# Patient Record
Sex: Female | Born: 1970
Health system: Southern US, Community
[De-identification: ages and names within clinical notes are randomized; demographics above are authoritative.]

## PROBLEM LIST (undated history)

## (undated) DIAGNOSIS — Z9851 Tubal ligation status: Secondary | ICD-10-CM

## (undated) HISTORY — DX: Tubal ligation status: Z98.51

---

## 1991-12-03 DIAGNOSIS — Z9851 Tubal ligation status: Secondary | ICD-10-CM

## 1991-12-03 HISTORY — PX: TUBAL LIGATION: SHX77

## 1991-12-03 HISTORY — DX: Tubal ligation status: Z98.51

## 2018-07-30 ENCOUNTER — Encounter: Payer: Self-pay | Admitting: Family Medicine

## 2018-08-04 ENCOUNTER — Other Ambulatory Visit (HOSPITAL_COMMUNITY)
Admission: RE | Admit: 2018-08-04 | Discharge: 2018-08-04 | Disposition: A | Discharge status: Home / Self Care | Payer: BLUE CROSS/BLUE SHIELD | Source: Ambulatory Visit | Attending: Family Medicine | Admitting: Family Medicine

## 2018-08-04 ENCOUNTER — Ambulatory Visit (INDEPENDENT_AMBULATORY_CARE_PROVIDER_SITE_OTHER): Payer: BLUE CROSS/BLUE SHIELD | Admitting: Family Medicine

## 2018-08-04 ENCOUNTER — Encounter: Payer: Self-pay | Admitting: Family Medicine

## 2018-08-04 VITALS — BP 108/66 | HR 66 | Temp 98.2°F | Ht 66.0 in | Wt 129.8 lb

## 2018-08-04 DIAGNOSIS — Z1231 Encounter for screening mammogram for malignant neoplasm of breast: Secondary | ICD-10-CM | POA: Diagnosis not present

## 2018-08-04 DIAGNOSIS — Z114 Encounter for screening for human immunodeficiency virus [HIV]: Secondary | ICD-10-CM | POA: Diagnosis not present

## 2018-08-04 DIAGNOSIS — Z0000 Encounter for general adult medical examination without abnormal findings: Secondary | ICD-10-CM

## 2018-08-04 DIAGNOSIS — Z01419 Encounter for gynecological examination (general) (routine) without abnormal findings: Secondary | ICD-10-CM

## 2018-08-04 DIAGNOSIS — Z124 Encounter for screening for malignant neoplasm of cervix: Secondary | ICD-10-CM

## 2018-08-04 DIAGNOSIS — Z23 Encounter for immunization: Secondary | ICD-10-CM | POA: Diagnosis not present

## 2018-08-04 DIAGNOSIS — Z1239 Encounter for other screening for malignant neoplasm of breast: Secondary | ICD-10-CM

## 2018-08-04 LAB — COMPREHENSIVE METABOLIC PANEL
ALBUMIN: 4.2 g/dL (ref 3.5–5.2)
ALT: 10 U/L (ref 0–35)
AST: 14 U/L (ref 0–37)
Alkaline Phosphatase: 40 U/L (ref 39–117)
BILIRUBIN TOTAL: 1 mg/dL (ref 0.2–1.2)
BUN: 19 mg/dL (ref 6–23)
CALCIUM: 9 mg/dL (ref 8.4–10.5)
CHLORIDE: 102 meq/L (ref 96–112)
CO2: 29 meq/L (ref 19–32)
CREATININE: 0.78 mg/dL (ref 0.40–1.20)
GFR: 83.62 mL/min (ref >60.00)
Glucose, Bld: 108 mg/dL — ABNORMAL HIGH (ref 70–99)
Potassium: 3.5 mEq/L (ref 3.5–5.1)
SODIUM: 139 meq/L (ref 135–145)
Total Protein: 6.6 g/dL (ref 6.0–8.3)

## 2018-08-04 LAB — CBC
HCT: 35.1 % — ABNORMAL LOW (ref 36.0–46.0)
Hemoglobin: 11.7 g/dL — ABNORMAL LOW (ref 12.0–15.0)
MCHC: 33.3 g/dL (ref 30.0–36.0)
MCV: 84.3 fl (ref 78.0–100.0)
PLATELETS: 265 10*3/uL (ref 150.0–400.0)
RBC: 4.17 Mil/uL (ref 3.87–5.11)
RDW: 17.2 % — ABNORMAL HIGH (ref 11.5–15.5)
WBC: 7 10*3/uL (ref 4.0–10.5)

## 2018-08-04 NOTE — Patient Instructions (Signed)
Great to meet you! 

## 2018-08-04 NOTE — Progress Notes (Signed)
Subjective:    Patient ID: Joanna Becker, female    DOB: 03/11/1971, 47 y.o.   MRN: [id number]  HPI  Patient presents to clinic as a new patient for physical exam.  Currently takes no medications and reports no chronic medical issues.  She is a non-smoker.  Had a tubal ligation in 1993 but otherwise reports no surgeries.  She is unsure of her last Pap smear, we will get this done today.  Last mammogram was May be 2 or 3 years ago.  Also unsure of last tetanus injection.  Declines flu vaccine today.  Reports no history of any cancers in her family, so she will not need colonoscopy screening until age 47. Also reports no other significant family history at this time.  Social History   Tobacco Use  . Smoking status: Never Smoker  . Smokeless tobacco: Never Used  Substance Use Topics  . Alcohol use: Yes    Past Surgical History:  Procedure Laterality Date  . TUBAL LIGATION  1993   Review of Systems  Constitutional: Negative for chills, fatigue and fever.  HENT: Negative for congestion, ear pain, sinus pain and sore throat.   Eyes: Negative.   Respiratory: Negative for cough, shortness of breath and wheezing.   Cardiovascular: Negative for chest pain, palpitations and leg swelling.  Gastrointestinal: Negative for abdominal pain, diarrhea, nausea and vomiting.  Genitourinary: Negative for dysuria, frequency and urgency.  Musculoskeletal: Negative for arthralgias and myalgias.  Skin: Negative for color change, pallor and rash.  Neurological: Negative for syncope, light-headedness and headaches.  Psychiatric/Behavioral: The patient is not nervous/anxious.       Objective:   Physical Exam  Constitutional: She is oriented to person, place, and time. She appears well-developed and well-nourished. No distress.  HENT:  Head: Normocephalic and atraumatic.  Right Ear: External ear normal.  Left Ear: External ear normal.  Mouth/Throat: Oropharynx is clear and moist. No  oropharyngeal exudate.  Eyes: Pupils are equal, round, and reactive to light. Conjunctivae and EOM are normal. No scleral icterus.  Neck: Normal range of motion. Neck supple. No JVD present. No tracheal deviation present. No thyromegaly present.  Cardiovascular: Normal rate and regular rhythm.  No murmur heard. Pulmonary/Chest: Effort normal and breath sounds normal. No respiratory distress. Right breast exhibits no inverted nipple, no mass, no nipple discharge, no skin change and no tenderness. Left breast exhibits no inverted nipple, no mass, no nipple discharge, no skin change and no tenderness.  No palpable lymphadenopathy in bilateral axilla.  Genitourinary: Rectum normal, vagina normal and uterus normal. Cervix exhibits no motion tenderness and no discharge. Right adnexum displays no tenderness. Left adnexum displays no tenderness. No erythema, tenderness or bleeding in the vagina. No foreign body in the vagina. No signs of injury around the vagina. No vaginal discharge found.  Genitourinary Comments: Pap smear collected and sent to lab  Musculoskeletal: Normal range of motion. She exhibits no edema.  Neurological: She is alert and oriented to person, place, and time. No cranial nerve deficit. Coordination normal.  Skin: Skin is warm and dry. Capillary refill takes less than 2 seconds. No pallor.  Psychiatric: She has a normal mood and affect. Her behavior is normal. Thought content normal.  Nursing note and vitals reviewed.  Vitals:   08/04/18 1404  BP: 108/66  Pulse: 66  Temp: 98.2 F (36.8 C)  SpO2: 98%      Assessment & Plan:   Adult wellness -patient will get a CBC, CMP and  HIV screening as part wellness exam lab work.  We will hold off on lipid panel due to patient not be fasting at this time.  Cervical cancer screening performed via Pap smear.  Mammogram ordered for breast cancer screening.  Patient given tetanus vaccine today so she is up to date with her current health  maintenance requirements for her age.  Good diet and regular exercise recommended.   Patient is a healthy 47 year old female.  Follow-up in 1 year for physical exam, return to clinic sooner if issues arise.

## 2018-08-05 ENCOUNTER — Encounter: Payer: Self-pay | Admitting: Family Medicine

## 2018-08-05 LAB — HIV ANTIBODY (ROUTINE TESTING W REFLEX): HIV 1&2 Ab, 4th Generation: NONREACTIVE

## 2018-08-10 LAB — CYTOLOGY - PAP
DIAGNOSIS: NEGATIVE
HPV (WINDOPATH): NOT DETECTED

## 2018-08-13 ENCOUNTER — Telehealth: Payer: Self-pay | Admitting: Family Medicine

## 2018-08-13 NOTE — Telephone Encounter (Signed)
Pt dropped off cpe form to be signed. placed in G[facility] color folder upfront. Please call [phone] when completed

## 2018-08-20 NOTE — Telephone Encounter (Signed)
Patient called back and ask could she Email form advised patient this was fine, email came through with patient on phone advised form completed and ready for Pick up. Placed form at front desk for pickup.

## 2018-08-24 ENCOUNTER — Encounter: Payer: Self-pay | Admitting: Family Medicine

## 2018-08-24 ENCOUNTER — Ambulatory Visit (INDEPENDENT_AMBULATORY_CARE_PROVIDER_SITE_OTHER): Payer: BLUE CROSS/BLUE SHIELD | Admitting: Family Medicine

## 2018-08-24 VITALS — BP 102/68 | HR 57 | Temp 98.3°F | Ht 66.0 in | Wt 128.6 lb

## 2018-08-24 DIAGNOSIS — N924 Excessive bleeding in the premenopausal period: Secondary | ICD-10-CM | POA: Diagnosis not present

## 2018-08-24 DIAGNOSIS — N939 Abnormal uterine and vaginal bleeding, unspecified: Secondary | ICD-10-CM | POA: Diagnosis not present

## 2018-08-24 LAB — CBC WITH DIFFERENTIAL/PLATELET
Basophils Absolute: 0 10*3/uL (ref 0.0–0.1)
Basophils Relative: 0.8 % (ref 0.0–3.0)
EOS PCT: 3.9 % (ref 0.0–5.0)
Eosinophils Absolute: 0.2 10*3/uL (ref 0.0–0.7)
HCT: 30.3 % — ABNORMAL LOW (ref 36.0–46.0)
Hemoglobin: 10.2 g/dL — ABNORMAL LOW (ref 12.0–15.0)
LYMPHS ABS: 1.2 10*3/uL (ref 0.7–4.0)
Lymphocytes Relative: 24.5 % (ref 12.0–46.0)
MCHC: 33.6 g/dL (ref 30.0–36.0)
MCV: 84.4 fl (ref 78.0–100.0)
MONO ABS: 0.4 10*3/uL (ref 0.1–1.0)
Monocytes Relative: 8.2 % (ref 3.0–12.0)
NEUTROS ABS: 3.1 10*3/uL (ref 1.4–7.7)
Neutrophils Relative %: 62.6 % (ref 43.0–77.0)
Platelets: 287 10*3/uL (ref 150.0–400.0)
RBC: 3.58 Mil/uL — ABNORMAL LOW (ref 3.87–5.11)
RDW: 16.9 % — AB (ref 11.5–15.5)
WBC: 4.9 10*3/uL (ref 4.0–10.5)

## 2018-08-24 LAB — FOLLICLE STIMULATING HORMONE: FSH: 15 m[IU]/mL

## 2018-08-24 LAB — LUTEINIZING HORMONE: LH: 2.08 m[IU]/mL

## 2018-08-24 NOTE — Progress Notes (Signed)
   Subjective:    Patient ID: Joanna Becker, female    DOB: 29-Oct-1971, 47 y.o.   MRN: [id number]  HPI  Patient presents to clinic due to heavy menses for 2 weeks.  States her periods have become more irregular over the past year or so.  States she has been going through a large pad every 2-3 hours, describes bleeding as bright [place] with thick clots.  Pap smear was done on August 04, 2018 negative for abnormality and pelvic exam was normal at that time.  States she spoke to her mother and mom states she went through menopause in her late 6530s, also states her mom told her her menopausal symptoms began with very heavy periods.   Social History   Tobacco Use  . Smoking status: Never Smoker  . Smokeless tobacco: Never Used  Substance Use Topics  . Alcohol use: Yes   Review of Systems  Constitutional: Negative for chills, fatigue and fever.  HENT: Negative for congestion, ear pain, sinus pain and sore throat.   Eyes: Negative.   Respiratory: Negative for cough, shortness of breath and wheezing.   Cardiovascular: Negative for chest pain, palpitations and leg swelling.  Gastrointestinal: Negative for abdominal pain, diarrhea, nausea and vomiting.  Genitourinary: Negative for dysuria, frequency and urgency.  Musculoskeletal: Negative for arthralgias and myalgias.  Skin: Negative for color change, pallor and rash.  Neurological: Negative for syncope, light-headedness and headaches.  Psychiatric/Behavioral: The patient is not nervous/anxious.       Objective:   Physical Exam  Constitutional: She is oriented to person, place, and time. She appears well-developed and well-nourished. No distress.  HENT:  Head: Normocephalic and atraumatic.  Eyes: No scleral icterus.  Neck: Neck supple. No tracheal deviation present.  Cardiovascular: Normal rate and regular rhythm.  Pulmonary/Chest: Effort normal and breath sounds normal.  Abdominal: Soft. Bowel sounds are normal. She exhibits no  distension. There is no tenderness.  Musculoskeletal: She exhibits no edema.  Neurological: She is alert and oriented to person, place, and time.  Skin: Skin is warm and dry. Capillary refill takes less than 2 seconds. No rash noted. No pallor.  Psychiatric: She has a normal mood and affect. Her behavior is normal.  Nursing note and vitals reviewed.   Vitals:   08/24/18 0907  BP: 102/68  Pulse: (!) 57  Temp: 98.3 F (36.8 C)  SpO2: 99%      Assessment & Plan:    Abnormal vaginal bleeding - we will get lab work to check CBC, iron levels and also hormone levels to further assess if patient is in menopause.  I suspect she could be perimenopausal due to her mother's younger age of going through menopause, and the symptoms patient currently having a very similar to symptoms her mother experience at the beginning of her menopause.  We will also get pelvic ultrasound for imaging of the uterus and ovaries to ensure she does not have fibroids or cysts that could be contributing to this heavy bleeding.    We will base next step in plan of care off the results of the lab work and also pelvic ultrasound.

## 2018-08-28 NOTE — Addendum Note (Signed)
Addended by: Leanora Cover on: 08/28/2018 12:45 PM   Modules accepted: Orders

## 2018-08-29 LAB — IRON,TIBC AND FERRITIN PANEL
%SAT: 11 % (calc) — ABNORMAL LOW (ref 16–45)
FERRITIN: 10 ng/mL — AB (ref 16–232)
Iron: 36 ug/dL — ABNORMAL LOW (ref 40–190)
TIBC: 338 mcg/dL (calc) (ref 250–450)

## 2018-08-29 LAB — PROGESTERONE

## 2018-08-29 LAB — ESTROGENS, TOTAL: ESTROGEN: 298.6 pg/mL

## 2018-08-31 NOTE — Addendum Note (Signed)
Addended by: Leanora Cover on: 08/31/2018 08:17 AM   Modules accepted: Orders

## 2018-09-01 ENCOUNTER — Ambulatory Visit
Admission: RE | Admit: 2018-09-01 | Discharge: 2018-09-01 | Disposition: A | Discharge status: Home / Self Care | Payer: BLUE CROSS/BLUE SHIELD | Source: Ambulatory Visit | Attending: Family Medicine | Admitting: Family Medicine

## 2018-09-01 DIAGNOSIS — N83201 Unspecified ovarian cyst, right side: Secondary | ICD-10-CM | POA: Diagnosis not present

## 2018-09-01 DIAGNOSIS — N939 Abnormal uterine and vaginal bleeding, unspecified: Secondary | ICD-10-CM | POA: Insufficient documentation

## 2018-09-02 ENCOUNTER — Encounter: Payer: Self-pay | Admitting: Lab

## 2018-09-10 ENCOUNTER — Telehealth: Payer: Self-pay | Admitting: Family Medicine

## 2018-09-10 ENCOUNTER — Encounter: Payer: BLUE CROSS/BLUE SHIELD | Admitting: Obstetrics and Gynecology

## 2018-09-10 DIAGNOSIS — N924 Excessive bleeding in the premenopausal period: Secondary | ICD-10-CM

## 2018-09-10 DIAGNOSIS — E611 Iron deficiency: Secondary | ICD-10-CM

## 2018-09-10 MED ORDER — FERROUS SULFATE 325 (65 FE) MG PO TABS: 325.0000 mg | ORAL_TABLET | Freq: Every day | ORAL | 1 refills | Status: AC

## 2018-09-10 NOTE — Telephone Encounter (Signed)
Patient returning call.

## 2018-09-10 NOTE — Telephone Encounter (Signed)
Copied from CRM [id number]. Topic: Quick Communication - See Telephone Encounter >> Sep 10, 2018 11:57 AM Windy Kalata, NT wrote: CRM for notification. See Telephone encounter for: 09/10/18.  Patient is calling and states she missed a call from Leanora Cover needing the pharmacy to send her iron supplement too.  [facility] [address] ([address]), [place] - [address] [place] ([place]) [place] [address] Phone: [phone] Fax: [phone]

## 2018-09-10 NOTE — Telephone Encounter (Signed)
Iron sent in  Has her bleeding stopped? Did she see GYN yet?

## 2018-09-10 NOTE — Telephone Encounter (Signed)
Called Pt to tell her of Iron Rx at the pharmacy. Pt also said that her bleeding stopped on 9/30, Her Gyn appt is Oct 21st and she did have a [place] app on Oct 2nd( and she went to that appt)

## 2018-09-10 NOTE — Telephone Encounter (Signed)
Patient  called and stated that she missed a call from Lauren Guse needing the pharmacy to send her iron supplement too.  [facility] [address] ([address]), [place] - [address] [place] ([place]) [place] [address] Phone: [phone] Fax: [phone]

## 2018-09-10 NOTE — Telephone Encounter (Signed)
OK thank you 

## 2018-09-21 ENCOUNTER — Encounter: Payer: BLUE CROSS/BLUE SHIELD | Admitting: Obstetrics & Gynecology

## 2018-10-06 ENCOUNTER — Encounter: Payer: Self-pay | Admitting: Obstetrics & Gynecology

## 2018-10-06 ENCOUNTER — Encounter: Payer: Self-pay | Admitting: Family Medicine

## 2018-10-06 ENCOUNTER — Ambulatory Visit (INDEPENDENT_AMBULATORY_CARE_PROVIDER_SITE_OTHER): Payer: BLUE CROSS/BLUE SHIELD | Admitting: Obstetrics & Gynecology

## 2018-10-06 VITALS — BP 102/63 | HR 64 | Ht 66.0 in | Wt 128.0 lb

## 2018-10-06 DIAGNOSIS — N924 Excessive bleeding in the premenopausal period: Secondary | ICD-10-CM

## 2018-10-06 NOTE — Progress Notes (Signed)
GYNECOLOGY OFFICE VISIT NOTE  History:  47 y.o. G2P0 here today for discussion about abnormal bleeding attributed to perimenopause.  Had irregular bleeding last month and was evaluated by her PCP. Normal labs, normal pelvic ultrasound, benign pap. She was told to follow up here for further management. No bleeding currently. She denies any current abnormal vaginal discharge, pelvic pain or other concerns.   Past Medical History:  Diagnosis Date  . Tubal ligation status 1993    Past Surgical History:  Procedure Laterality Date  . TUBAL LIGATION  1993    The following portions of the patient's history were reviewed and updated as appropriate: allergies, current medications, past family history, past medical history, past social history, past surgical history and problem list.   Health Maintenance:  Normal pap and negative HRHPV on 08/04/2018.  Normal mammogram last year, next one ordered.  Review of Systems:  Pertinent items noted in HPI and remainder of comprehensive ROS otherwise negative.  Objective:  Physical Exam BP 102/63   Pulse 64   Ht 5\' 6"  (1.676 m)   Wt 128 lb (58.1 kg)   LMP 09/19/2018 (Exact Date)   BMI 20.66 kg/m  CONSTITUTIONAL: Well-developed, well-nourished female in no acute distress.  HEENT:  Normocephalic, atraumatic. External right and left ear normal. No scleral icterus.  NECK: Normal range of motion, supple, no masses noted on observation SKIN: Skin is warm and dry. No rash noted. Not diaphoretic. No erythema. No pallor. MUSCULOSKELETAL: Normal range of motion. No edema noted. NEUROLOGIC: Alert and oriented to person, place, and time. Normal muscle tone coordination. No cranial nerve deficit noted. PSYCHIATRIC: Normal mood and affect. Normal behavior. Normal judgment and thought content. CARDIOVASCULAR: Normal heart rate noted RESPIRATORY: Effort and breath sounds normal, no problems with respiration noted ABDOMEN: Soft, no distention noted.   PELVIC:  Deferred  Labs and Imaging CBC Latest Ref Rng & Units 08/24/2018 08/04/2018  WBC 4.0 - 10.5 K/uL 4.9 7.0  Hemoglobin 12.0 - 15.0 g/dL 10.2(L) 11.7(L)  Hematocrit 36.0 - 46.0 % 30.3(L) 35.1(L)  Platelets 150.0 - 400.0 K/uL 287.0 265.0   US Pelvic Complete With Transvaginal  Result Date: 09/01/2018 CLINICAL DATA:  Heavy vaginal bleeding for 2 weeks EXAM: TRANSABDOMINAL AND TRANSVAGINAL ULTRASOUND OF PELVIS TECHNIQUE: Both transabdominal and transvaginal ultrasound examinations of the pelvis were performed. Transabdominal technique was performed for global imaging of the pelvis including uterus, ovaries, adnexal regions, and pelvic cul-de-sac. It was necessary to proceed with endovaginal exam following the transabdominal exam to visualize the uterus endometrium and ovaries. COMPARISON:  None FINDINGS: Uterus Measurements: 8.1 x 4.2 x 5.5 cm. No fibroids or other mass visualized. Endometrium Thickness: 10.5 mm.  No focal abnormality visualized. Right ovary Measurements: 4.3 x 3.2 x 3.7 cm. Prominent cyst in the right ovary measuring 3.7 x 2.9 x 3.5 cm. Adjacent smaller cysts in the right ovary. Left ovary Not seen Other findings No abnormal free fluid. IMPRESSION: 1. Endometrial thickness of 10.5 mm. If bleeding remains unresponsive to hormonal or medical therapy, sonohysterogram should be considered for focal lesion work-up. (Ref: Radiological Reasoning: Algorithmic Workup of Abnormal Vaginal Bleeding with Endovaginal Sonography and Sonohysterography. AJR 2008; 469:G29-52) 2. 3.7 cm right ovarian cyst. This is almost certainly benign, and no specific imaging follow up is recommended according to the Society of Radiologists in Ultrasound2010 Consensus Conference Statement (D Lenis Noon et al. Management of Asymptomatic Ovarian and Other Adnexal Cysts Imaged at [place]: Society of Radiologists in Ultrasound Consensus Conference Statement 2010. Radiology 256 (Sept  2010): 943-954.). Electronically Signed   By: Jasmine Pang  M.D.   On: 09/01/2018 16:57   Assessment & Plan:  Abnormal perimenopausal bleeding Labs and imaging results reviewed. No current bleeding. Discussed perimenopausal bleeding which could be irregular and heavy at time. Intervention is needed for prolonged bleeding or symptomatic anemia due to the bleeding.  Also, endometrial sampling or other surgical intervention could be needed.  If bleeding recurs and gets concerning, we could consider oral progesterone, Depo Provera, Levonogestrel IUD, endometrial ablation or hysterectomy as definitive surgical management. Printed patient education handouts were given to the patient to review at home.  Bleeding precautions reviewed, she was told to contact us for any concerns.    Please refer to After Visit Summary for other counseling recommendations.   Return if symptoms worsen or fail to improve.   Total face-to-face time with patient: 20 minutes.  Over 50% of encounter was spent on counseling and coordination of care.   Jaynie Collins, MD, FACOG Obstetrician & Gynecologist, [facility] for [employer], [facility]

## 2018-10-06 NOTE — Patient Instructions (Signed)
Perimenopause Perimenopause is the time when your body begins to move into the menopause (no menstrual period for 12 straight months). It is a natural process. Perimenopause can begin 2-8 years before the menopause and usually lasts for 1 year after the menopause. During this time, your ovaries may or may not produce an egg. The ovaries vary in their production of estrogen and progesterone hormones each month. This can cause irregular menstrual periods, difficulty getting pregnant, vaginal bleeding between periods, and uncomfortable symptoms. What are the causes?  Irregular production of the ovarian hormones, estrogen and progesterone, and not ovulating every month. Other causes include:  Tumor of the pituitary gland in the brain.  Medical disease that affects the ovaries.  Radiation treatment.  Chemotherapy.  Unknown causes.  Heavy smoking and excessive alcohol intake can bring on perimenopause sooner.  What are the signs or symptoms?  Hot flashes.  Night sweats.  Irregular menstrual periods.  Decreased sex drive.  Vaginal dryness.  Headaches.  Mood swings.  Depression.  Memory problems.  Irritability.  Tiredness.  Weight gain.  Trouble getting pregnant.  The beginning of losing bone cells (osteoporosis).  The beginning of hardening of the arteries (atherosclerosis). How is this diagnosed? Your health care provider will make a diagnosis by analyzing your age, menstrual history, and symptoms. He or she will do a physical exam and note any changes in your body, especially your female organs. Female hormone tests may or may not be helpful depending on the amount of female hormones you produce and when you produce them. However, other hormone tests may be helpful to rule out other problems. How is this treated? In some cases, no treatment is needed. The decision on whether treatment is necessary during the perimenopause should be made by you and your health care  provider based on how the symptoms are affecting you and your lifestyle. Various treatments are available, such as:  Treating individual symptoms with a specific medicine for that symptom.  Herbal medicines that can help specific symptoms.  Counseling.  Group therapy.  Follow these instructions at home:  Keep track of your menstrual periods (when they occur, how heavy they are, how long between periods, and how long they last) as well as your symptoms and when they started.  Only take over-the-counter or prescription medicines as directed by your health care provider.  Sleep and rest.  Exercise.  Eat a diet that contains calcium (good for your bones) and soy (acts like the estrogen hormone).  Do not smoke.  Avoid alcoholic beverages.  Take vitamin supplements as recommended by your health care provider. Taking vitamin E may help in certain cases.  Take calcium and vitamin D supplements to help prevent bone loss.  Group therapy is sometimes helpful.  Acupuncture may help in some cases. Contact a health care provider if:  You have questions about any symptoms you are having.  You need a referral to a specialist (gynecologist, psychiatrist, or psychologist). Get help right away if:  You have vaginal bleeding.  Your period lasts longer than 8 days.  Your periods are recurring sooner than 21 days.  You have bleeding after intercourse.  You have severe depression.  You have pain when you urinate.  You have severe headaches.  You have vision problems. This information is not intended to replace advice given to you by your health care provider. Make sure you discuss any questions you have with your health care provider. Document Released: 12/26/2004 Document Revised: 04/25/2016 Document Reviewed: 06/17/2013  Elsevier Interactive Patient Education  2017 [employer]     Endometrial Ablation Endometrial ablation is a procedure that destroys the thin inner layer  of the lining of the uterus (endometrium). This procedure may be done:  To stop heavy periods.  To stop bleeding that is causing anemia.  To control irregular bleeding.  To treat bleeding caused by small tumors (fibroids) in the endometrium.  This procedure is often an alternative to major surgery, such as removal of the uterus and cervix (hysterectomy). As a result of this procedure:  You may not be able to have children. However, if you are premenopausal (you have not gone through menopause): ? You may still have a small chance of getting pregnant. ? You will need to use a reliable method of birth control after the procedure to prevent pregnancy.  You may stop having a menstrual period, or you may have only a small amount of bleeding during your period. Menstruation may return several years after the procedure.  Tell a health care provider about:  Any allergies you have.  All medicines you are taking, including vitamins, herbs, eye drops, creams, and over-the-counter medicines.  Any problems you or family members have had with the use of anesthetic medicines.  Any blood disorders you have.  Any surgeries you have had.  Any medical conditions you have. What are the risks? Generally, this is a safe procedure. However, problems may occur, including:  A hole (perforation) in the uterus or bowel.  Infection of the uterus, bladder, or vagina.  Bleeding.  Damage to other structures or organs.  An air bubble in the lung (air embolus).  Problems with pregnancy after the procedure.  Failure of the procedure.  Decreased ability to diagnose cancer in the endometrium.  What happens before the procedure?  You will have tests of your endometrium to make sure there are no pre-cancerous cells or cancer cells present.  You may have an ultrasound of the uterus.  You may be given medicines to thin the endometrium.  Ask your health care provider about: ? Changing or stopping  your regular medicines. This is especially important if you take diabetes medicines or blood thinners. ? Taking medicines such as aspirin and ibuprofen. These medicines can thin your blood. Do not take these medicines before your procedure if your doctor tells you not to.  Plan to have someone take you home from the hospital or clinic. What happens during the procedure?  You will lie on an exam table with your feet and legs supported as in a pelvic exam.  To lower your risk of infection: ? Your health care team will wash or sanitize their hands and put on germ-free (sterile) gloves. ? Your genital area will be washed with soap.  An IV tube will be inserted into one of your veins.  You will be given a medicine to help you relax (sedative).  A surgical instrument with a light and camera (resectoscope) will be inserted into your vagina and moved into your uterus. This allows your surgeon to see inside your uterus.  Endometrial tissue will be removed using one of the following methods: ? Radiofrequency. This method uses a radiofrequency-alternating electric current to remove the endometrium. ? Cryotherapy. This method uses extreme cold to freeze the endometrium. ? Heated-free liquid. This method uses a heated saltwater (saline) solution to remove the endometrium. ? Microwave. This method uses high-energy microwaves to heat up the endometrium and remove it. ? Thermal balloon. This method involves inserting  a catheter with a balloon tip into the uterus. The balloon tip is filled with heated fluid to remove the endometrium. The procedure may vary among health care providers and hospitals. What happens after the procedure?  Your blood pressure, heart rate, breathing rate, and blood oxygen level will be monitored until the medicines you were given have worn off.  As tissue healing occurs, you may notice vaginal bleeding for 4-6 weeks after the procedure. You may also  experience: ? Cramps. ? Thin, watery vaginal discharge that is light pink or brown in color. ? A need to urinate more frequently than usual. ? Nausea.  Do not drive for 24 hours if you were given a sedative.  Do not have sex or insert anything into your vagina until your health care provider approves. Summary  Endometrial ablation is done to treat the many causes of heavy menstrual bleeding.  The procedure may be done only after medications have been tried to control the bleeding.  Plan to have someone take you home from the hospital or clinic. This information is not intended to replace advice given to you by your health care provider. Make sure you discuss any questions you have with your health care provider. Document Released: 09/27/2004 Document Revised: 12/05/2016 Document Reviewed: 12/05/2016 Elsevier Interactive Patient Education  2017 [employer]    Hysterectomy Information A hysterectomy is a surgery to remove your uterus. After surgery, you will no longer have periods. Also, you will not be able to get pregnant. Reasons for this surgery  You have bleeding that is not normal and keeps coming back.  You have lasting (chronic) lower belly (pelvic) pain.  You have a lasting infection.  The lining of your uterus grows outside your uterus.  The lining of your uterus grows in the muscle of your uterus.  Your uterus falls down into your vagina.  You have a growth in your uterus that causes problems.  You have cells that could turn into cancer (precancerous cells).  You have cancer of the uterus or cervix. Types There are 3 types of hysterectomies. Depending on the type, the surgery will:  Remove the top part of the uterus only.  Remove the uterus and the cervix.  Remove the uterus, cervix, and tissue that holds the uterus in place in the lower belly.  Ways a hysterectomy can be performed There are 5 ways this surgery can be performed.  A cut (incision) is  made in the belly (abdomen). The uterus is taken out through the cut.  A cut is made in the vagina. The uterus is taken out through the cut.  Three or four cuts are made in the belly. A surgical device with a camera is put through one of the cuts. The uterus is cut into small pieces. The uterus is taken out through the cuts or the vagina.  Three or four cuts are made in the belly. A surgical device with a camera is put through one of the cuts. The uterus is taken out through the vagina.  Three or four cuts are made in the belly. A surgical device that is controlled by a computer makes a visual image. The device helps the surgeon control the surgical tools. The uterus is cut into small pieces. The pieces are taken out through the cuts or through the vagina.  What can I expect after the surgery?  You will be given pain medicine.  You will need help at home for 3-5 days after surgery.  You will need to see your doctor in 2-4 weeks after surgery.  You may get hot flashes, have night sweats, and have trouble sleeping.  You may need to have Pap tests in the future if your surgery was related to cancer. Talk to your doctor. It is still good to have regular exams. This information is not intended to replace advice given to you by your health care provider. Make sure you discuss any questions you have with your health care provider. Document Released: 02/10/2012 Document Revised: 04/25/2016 Document Reviewed: 07/26/2013 Elsevier Interactive Patient Education  [employer].

## 2018-10-14 ENCOUNTER — Other Ambulatory Visit: Payer: Self-pay | Admitting: Family Medicine

## 2018-10-14 DIAGNOSIS — Z1239 Encounter for other screening for malignant neoplasm of breast: Secondary | ICD-10-CM

## 2018-12-04 ENCOUNTER — Ambulatory Visit
Admission: RE | Admit: 2018-12-04 | Discharge: 2018-12-04 | Disposition: A | Discharge status: Home / Self Care | Payer: Commercial Managed Care - PPO | Source: Ambulatory Visit | Attending: Family Medicine | Admitting: Family Medicine

## 2018-12-04 DIAGNOSIS — Z1239 Encounter for other screening for malignant neoplasm of breast: Secondary | ICD-10-CM | POA: Diagnosis not present

## 2018-12-16 ENCOUNTER — Encounter: Payer: Self-pay | Admitting: Lab

## 2019-01-23 IMAGING — MG DIGITAL SCREENING BILATERAL MAMMOGRAM WITH TOMO AND CAD
8 series · 9 of 24 positions shown · non-contrast
Comparison: Previous exam(s).

CLINICAL DATA: Screening.

EXAM:
DIGITAL SCREENING BILATERAL MAMMOGRAM WITH TOMO AND CAD

[L MLO synth-2D]
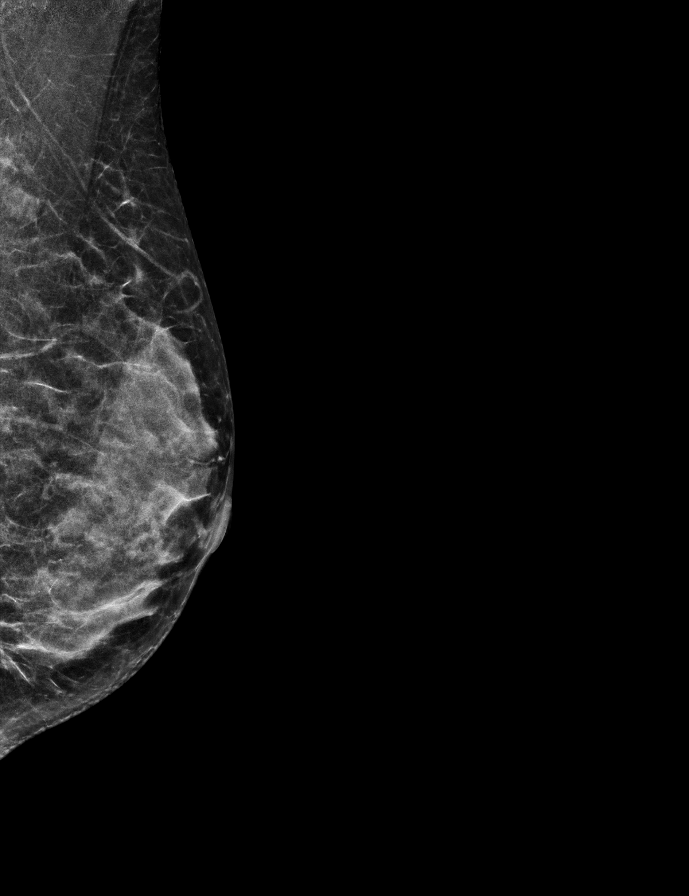

[R CC synth-2D]
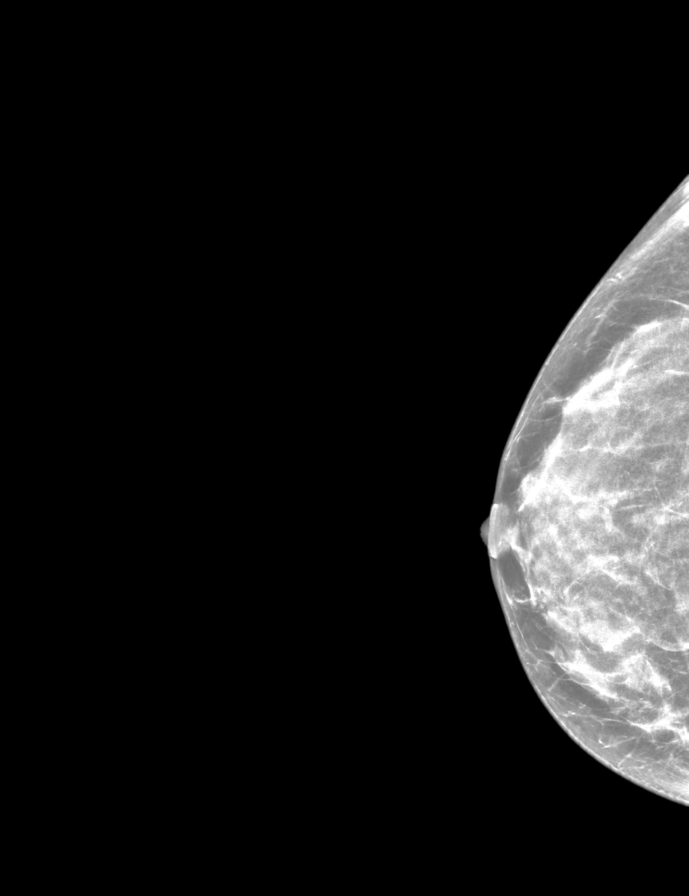

[R MLO synth-2D]
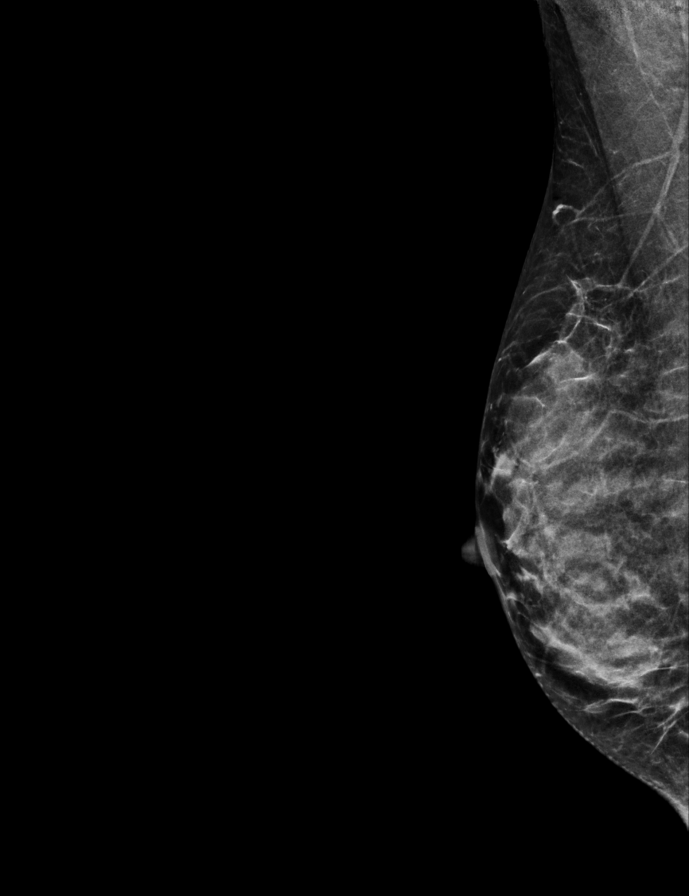

[L CC synth-2D]
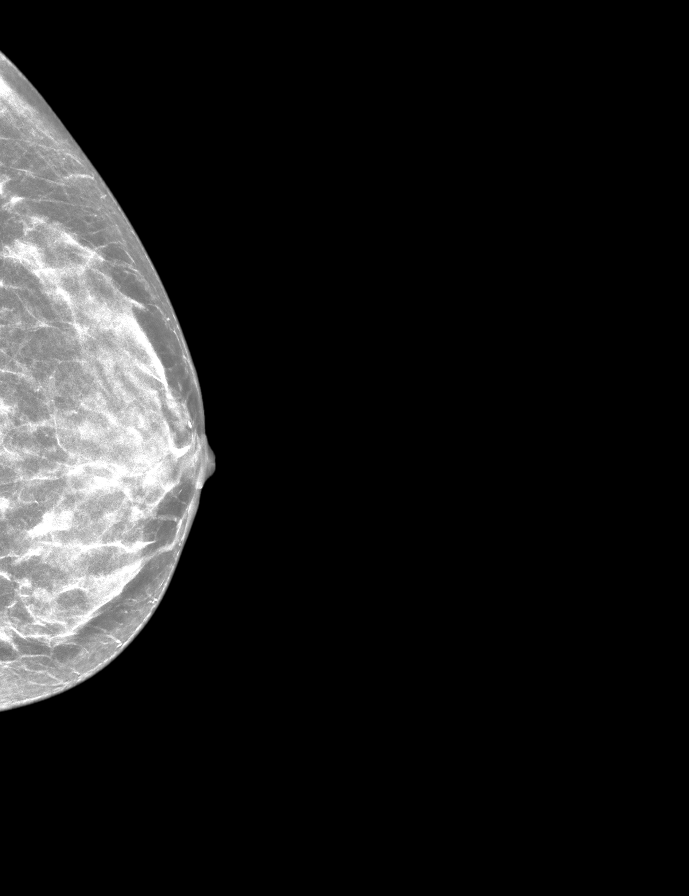

[L CC tomo · 2 of 40 frames shown]
[frame 13/40]
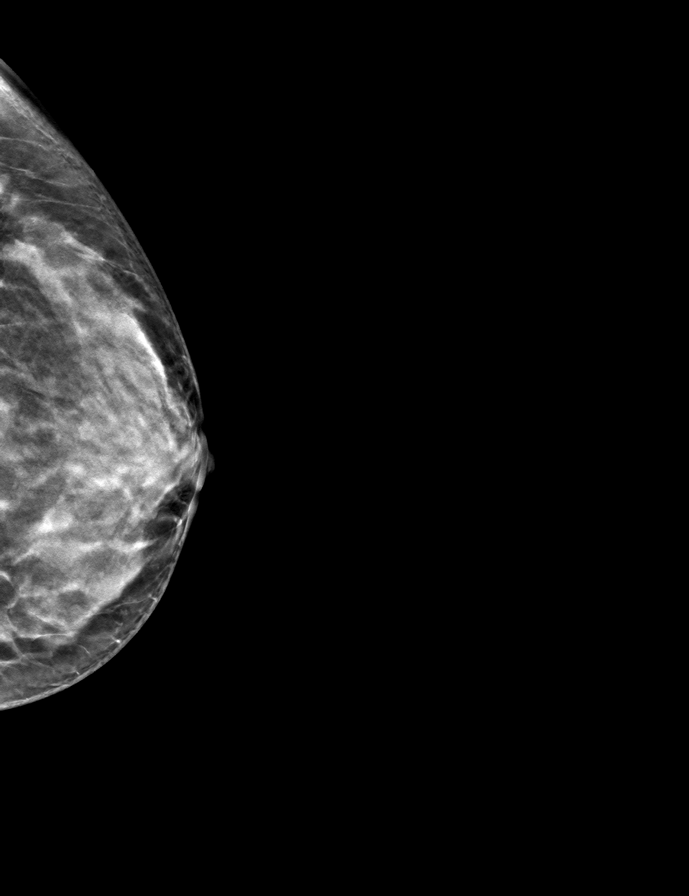
[frame 21/40]
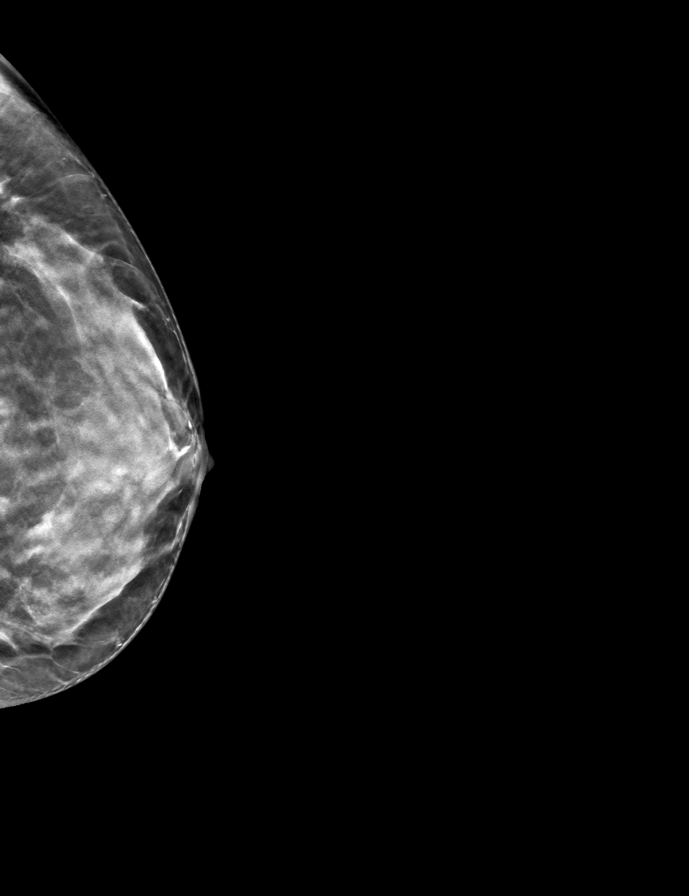

[R MLO tomo · tomo slice 21/41.0]
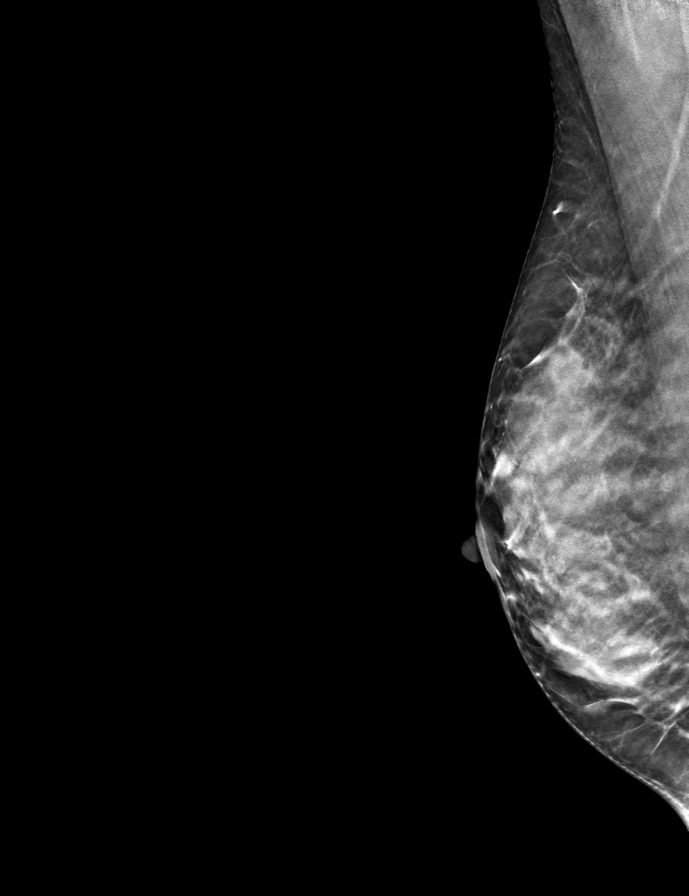

[L MLO tomo · tomo slice 23/44.0]
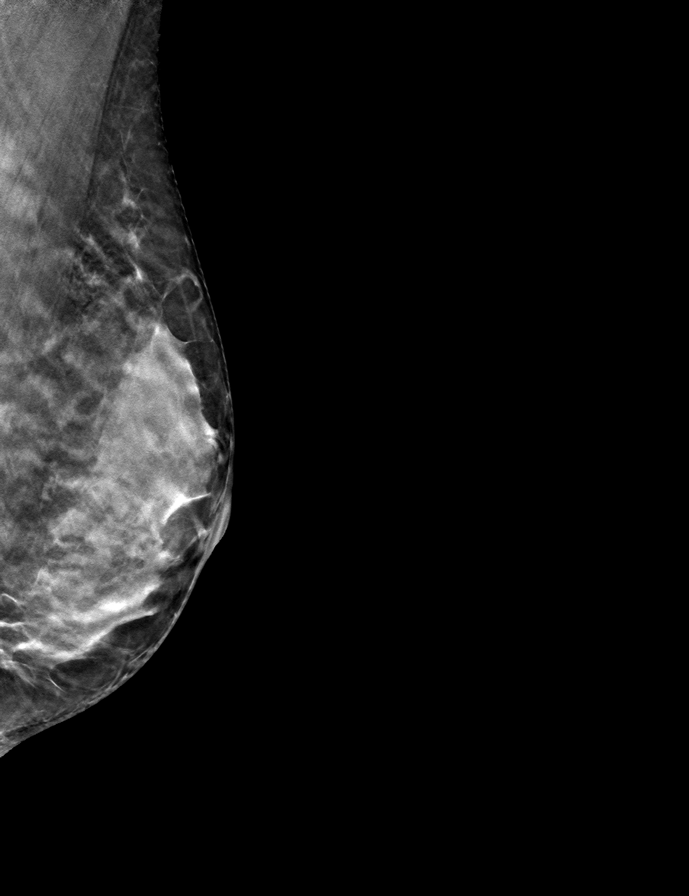

[R CC tomo · tomo slice 22/43.0]
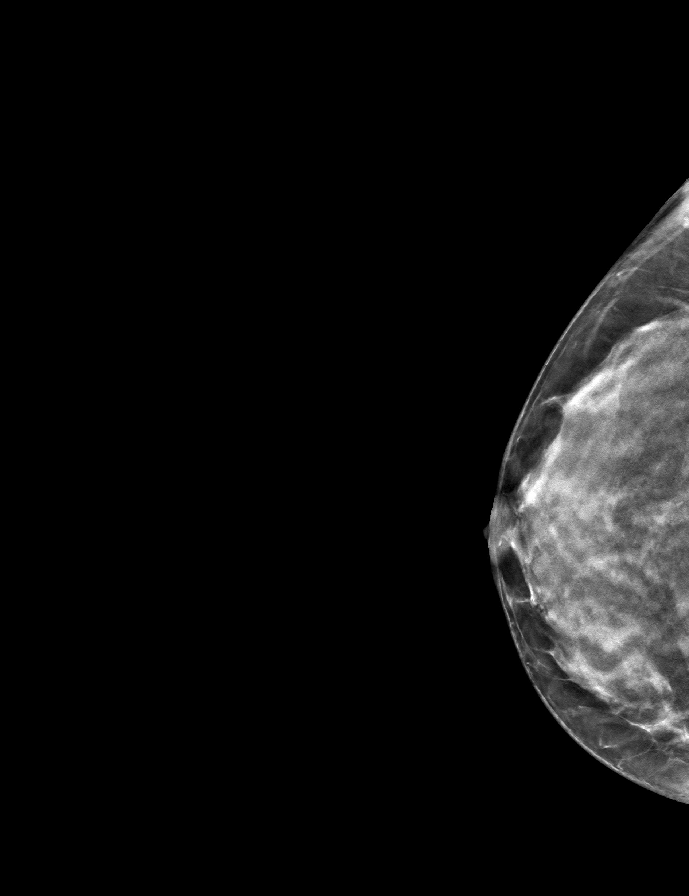

[9 of 24 positions shown; findings below may reference images not displayed]

ACR Breast Density Category c: The breast tissue is heterogeneously
dense, which may obscure small masses.
FINDINGS: There are no findings suspicious for malignancy. Images were
processed with CAD.
IMPRESSION: No mammographic evidence of malignancy. A result letter of this
screening mammogram will be mailed directly to the patient.

RECOMMENDATION:
Screening mammogram in one year. (Code:[id number])

BI-RADS CATEGORY  1: Negative.

## 2019-08-05 ENCOUNTER — Encounter: Payer: BLUE CROSS/BLUE SHIELD | Admitting: Family Medicine

## 2019-08-05 DIAGNOSIS — Z0289 Encounter for other administrative examinations: Secondary | ICD-10-CM
